# Patient Record
Sex: Male | Born: 1995 | Race: White | Hispanic: No | Marital: Single | Smoking: Current every day smoker
Health system: Southern US, Community
[De-identification: ages and names within clinical notes are randomized; demographics above are authoritative.]

## PROBLEM LIST (undated history)

## (undated) DIAGNOSIS — J45909 Unspecified asthma, uncomplicated: Secondary | ICD-10-CM

---
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
## 2016-01-03 ENCOUNTER — Ambulatory Visit (INDEPENDENT_AMBULATORY_CARE_PROVIDER_SITE_OTHER): Payer: BC Managed Care – PPO

## 2016-01-03 ENCOUNTER — Ambulatory Visit
Admission: EM | Admit: 2016-01-03 | Discharge: 2016-01-03 | Disposition: A | Discharge status: Home / Self Care | Payer: BC Managed Care – PPO | Attending: Family Medicine | Admitting: Family Medicine

## 2016-01-03 DIAGNOSIS — F410 Panic disorder [episodic paroxysmal anxiety] without agoraphobia: Secondary | ICD-10-CM | POA: Diagnosis not present

## 2016-01-03 DIAGNOSIS — B356 Tinea cruris: Secondary | ICD-10-CM | POA: Diagnosis not present

## 2016-01-03 DIAGNOSIS — R0602 Shortness of breath: Secondary | ICD-10-CM | POA: Diagnosis present

## 2016-01-03 DIAGNOSIS — Z79899 Other long term (current) drug therapy: Secondary | ICD-10-CM | POA: Insufficient documentation

## 2016-01-03 DIAGNOSIS — F17210 Nicotine dependence, cigarettes, uncomplicated: Secondary | ICD-10-CM | POA: Insufficient documentation

## 2016-01-03 DIAGNOSIS — R531 Weakness: Secondary | ICD-10-CM | POA: Insufficient documentation

## 2016-01-03 DIAGNOSIS — L739 Follicular disorder, unspecified: Secondary | ICD-10-CM | POA: Diagnosis not present

## 2016-01-03 HISTORY — DX: Unspecified asthma, uncomplicated: J45.909

## 2016-01-03 MED ORDER — KETOCONAZOLE 2 % EX CREA: 1.0000 "application " | TOPICAL_CREAM | Freq: Every day | CUTANEOUS | Status: AC

## 2016-01-03 MED ORDER — MUPIROCIN 2 % EX OINT: 1.0000 "application " | TOPICAL_OINTMENT | Freq: Three times a day (TID) | CUTANEOUS | Status: AC

## 2016-01-03 MED ORDER — LORAZEPAM 1 MG PO TABS
1.0000 mg | ORAL_TABLET | Freq: Once | ORAL | Status: AC
  Administered 2016-01-03: 1 mg via ORAL

## 2016-01-03 MED ORDER — ALPRAZOLAM 0.5 MG PO TABS: 0.5000 mg | ORAL_TABLET | Freq: Three times a day (TID) | ORAL | Status: AC | PRN

## 2016-01-03 NOTE — ED Provider Notes (Signed)
CSN: [id number]     Arrival date & time 01/03/16  1722 History   None    Chief Complaint  Patient presents with  . Shortness of Breath   (Consider location/radiation/quality/duration/timing/severity/associated sxs/prior Treatment) HPI   This is a 20 year old male presents with hyperventilation-that started this morning. States he was having difficulty sleeping next refill sleep around 3 or 4:00 and awoke at 10. He is brought in by his mother. Summoned to his room because of his loud crying stating that he was unable to get any air in his hands and feet were numb. He has a history of asthma and he had a seizure in November from exhaustion and dehydration. He states that everything is good at home his mother states that they give him a lot of love when he comes home from college stairs and is not being bothered by anything to her knowledge. He is obviously upset and hyperventilating  when first seen. Is being home his mother states he's been complaining of heartburn symptoms and right now he states that his trachea feels like it's" closing".     Past Medical History  Diagnosis Date  . Asthma    History reviewed. No pertinent past surgical history. History reviewed. No pertinent family history. Social History  Substance Use Topics  . Smoking status: Current Every Day Smoker    Types: Cigarettes  . Smokeless tobacco: None  . Alcohol Use: Yes     Comment: Socially    Review of Systems  Constitutional: Negative for fever, chills and fatigue.  Gastrointestinal: Positive for nausea.  All other systems reviewed and are negative.   Allergies  Review of patient's allergies indicates no known allergies.  Home Medications   Prior to Admission medications   Medication Sig Start Date End Date Taking? Authorizing Provider  ALPRAZolam Prudy Feeler) 0.5 MG tablet Take 1 tablet (0.5 mg total) by mouth 3 (three) times daily as needed for anxiety. 01/03/16   Lutricia Feil, PA-C  cetirizine (ZYRTEC)  10 MG tablet Take 10 mg by mouth as needed for allergies.    Historical Provider, MD  ketoconazole (NIZORAL) 2 % cream Apply 1 application topically daily. 01/03/16   Lutricia Feil, PA-C  mupirocin ointment (BACTROBAN) 2 % Apply 1 application topically 3 (three) times daily. 01/03/16   Lutricia Feil, PA-C   Meds Ordered and Administered this Visit   Medications  LORazepam (ATIVAN) tablet 1 mg (1 mg Oral Given 01/03/16 1758)    BP 134/70 mmHg  Pulse 73  Temp(Src) 97.8 F (36.6 C) (Oral)  Resp 16  Ht  (1.778 m)  Wt 175 lb (79.379 kg)  BMI 25.11 kg/m2  SpO2 100% No data found.   Physical Exam  Constitutional: He is oriented to person, place, and time. He appears well-developed and well-nourished. No distress.  HENT:  Head: Normocephalic and atraumatic.  Nose: Nose normal.  Mouth/Throat: Oropharynx is clear and moist. No oropharyngeal exudate.  Eyes: Conjunctivae are normal. Pupils are equal, round, and reactive to light.  Neck: Normal range of motion. Neck supple.  Pulmonary/Chest: Effort normal. No respiratory distress. He has no wheezes. He has no rales.  Musculoskeletal: Normal range of motion. He exhibits no edema or tenderness.  Lymphadenopathy:    He has no cervical adenopathy.  Neurological: He is alert and oriented to person, place, and time.  Skin: Skin is warm and dry. He is not diaphoretic.  Psychiatric: His behavior is normal. Judgment and thought content normal.  Patient  is very agitated and crying loudly I first entered the room. I would placed a brown paper bag on his mouth and nose and after breathing for several minutes seemed to calm down somewhat. He was alert and oriented spelled that responded appropriately to questions 100 appropriately to questions  Nursing note and vitals reviewed.   ED Course  Procedures (including critical care time)  Labs Review Labs Reviewed - No data to display  Imaging Review Dg Chest 2 View  01/03/2016  CLINICAL DATA:   Shortness of breath since this morning with weakness and fatigue EXAM: CHEST  2 VIEW COMPARISON:  None. FINDINGS: The heart size and mediastinal contours are within normal limits. Both lungs are clear. The visualized skeletal structures are unremarkable. IMPRESSION: No active cardiopulmonary disease. Electronically Signed   By: Esperanza Heiraymond  Rubner M.D.   On: 01/03/2016 18:25     Visual Acuity Review  Right Eye Distance:   Left Eye Distance:   Bilateral Distance:    Right Eye Near:   Left Eye Near:    Bilateral Near:     ED ECG REPORT   Date: 01/03/2016  EKG Time: 10:36 PM  Rate:77  Rhythm: normal sinus rhythm and sinus arrhythmia,  Axis:normal Intervals:right bundle branch block  ST&T Change: No acute changes  Narrative Interpretation: NSR with sinus arrhythmia       17:58 Medication Given MH  LORazepam (ATIVAN) tablet 1 mg - Dose: 1 mg ; Route: Oral ; Scheduled Time: 1800              MDM   1. Panic attack   2. Tinea cruris   3. Folliculitis    Discharge Medication List as of 01/03/2016  6:59 PM    START taking these medications   Details  ALPRAZolam (XANAX) 0.5 MG tablet Take 1 tablet (0.5 mg total) by mouth 3 (three) times daily as needed for anxiety., Starting 01/03/2016, Until Discontinued, Print    ketoconazole (NIZORAL) 2 % cream Apply 1 application topically daily., Starting 01/03/2016, Until Discontinued, Normal    mupirocin ointment (BACTROBAN) 2 % Apply 1 application topically 3 (three) times daily., Starting 01/03/2016, Until Discontinued, Normal      Plan: 1. Test/x-ray results and diagnosis reviewed with patient 2. rx as per orders; risks, benefits, potential side effects reviewed with patient 3. Recommend supportive treatment with Judicious use of the Xanax. I told him to take this only with the onset of panic attack. He must follow-up with a primary care. As he is in college in [place] he needs to establish with one down there. He states he does not like the  student health center there. He has additional findings today of a tinea cruris and folliculitis of his upper thighs bilaterally. 4. F/u prn if symptoms worsen or don't improve     Lutricia FeilWilliam P Whitten Andreoni, PA-C 01/03/16 2236

## 2016-01-03 NOTE — Discharge Instructions (Signed)
Jock Itch Jock itch (tinea cruris) is a fungal infection of the skin in the groin area. It is sometimes called ringworm, even though it is not caused by worms. It is caused by a fungus, which is a type of germ that thrives in dark, damp places. Jock itch causes a rash and itching in the groin and upper thigh area. It usually goes away in 2-3 weeks with treatment. CAUSES The fungus that causes jock itch may be spread by:  Touching a fungus infection elsewhere on your body--such as athlete's foot--and then touching your groin area.  Sharing towels or clothing with an infected person. RISK FACTORS Jock itch is most common in men and adolescent boys. This condition is more likely to develop from:  Being in hot, humid climates.  Wearing tight-fitting clothing or wet bathing suits for long periods of time.  Participating in sports.  Being overweight.  Having diabetes. SYMPTOMS Symptoms of jock itch may include:  A red, pink, or brown rash in the groin area. The rash may spread to the thighs, anus, and buttocks.  Dry and scaly skin on or around the rash.  Itchiness. DIAGNOSIS Most often, a health care provider can make the diagnosis by looking at your rash. Sometimes, a scraping of the infected skin will be taken. This sample may be tested by looking at it under a microscope or by trying to grow the fungus from the sample (culture).  TREATMENT Treatment for this condition may include:  Antifungal medicine to kill the fungus. This may be in various forms:  Skin cream or ointment.  Medicine taken by mouth.  Skin cream or ointment to reduce the itching.  Compresses or medicated powders to dry the infected skin. HOME CARE INSTRUCTIONS  Take medicines only as directed by your health care provider. Apply skin creams or ointments exactly as directed.  Wear loose-fitting clothing.  Men should wear cotton boxer shorts.  Women should wear cotton underwear.  Change your underwear  every day to keep your groin dry.  Avoid hot baths.  Dry your groin area well after bathing.  Use a separate towel to dry your groin area. This will help to prevent a spreading of the infection to other areas of your body.  Do not scratch the affected area.  Do not share towels with other people. SEEK MEDICAL CARE IF:  Your rash does not improve or it gets worse after 2 weeks of treatment.  Your rash is spreading.  Your rash returns after treatment is finished.  You have a fever.  You have redness, swelling, or pain in the area around your rash.  You have fluid, blood, or pus coming from your rash.  Your have your rash for more than 4 weeks.   This information is not intended to replace advice given to you by your health care provider. Make sure you discuss any questions you have with your health care provider.   Document Released: 10/03/2002 Document Revised: 11/03/2014 Document Reviewed: 07/25/2014 Elsevier Interactive Patient Education 2016 [employer]  Panic Attacks Panic attacks are sudden, short-livedsurges of severe anxiety, fear, or discomfort. They may occur for no reason when you are relaxed, when you are anxious, or when you are sleeping. Panic attacks may occur for a number of reasons:   Healthy people occasionally have panic attacks in extreme, life-threatening situations, such as war or natural disasters. Normal anxiety is a protective mechanism of the body that helps us react to danger (fight or flight response).  Panic attacks are often seen with anxiety disorders, such as panic disorder, social anxiety disorder, generalized anxiety disorder, and phobias. Anxiety disorders cause excessive or uncontrollable anxiety. They may interfere with your relationships or other life activities.  Panic attacks are sometimes seen with other mental illnesses, such as depression and posttraumatic stress disorder.  Certain medical conditions, prescription medicines, and  drugs of abuse can cause panic attacks. SYMPTOMS  Panic attacks start suddenly, peak within 20 minutes, and are accompanied by four or more of the following symptoms:  Pounding heart or fast heart rate (palpitations).  Sweating.  Trembling or shaking.  Shortness of breath or feeling smothered.  Feeling choked.  Chest pain or discomfort.  Nausea or strange feeling in your stomach.  Dizziness, light-headedness, or feeling like you will faint.  Chills or hot flushes.  Numbness or tingling in your lips or hands and feet.  Feeling that things are not real or feeling that you are not yourself.  Fear of losing control or going crazy.  Fear of dying. Some of these symptoms can mimic serious medical conditions. For example, you may think you are having a heart attack. Although panic attacks can be very scary, they are not life threatening. DIAGNOSIS  Panic attacks are diagnosed through an assessment by your health care provider. Your health care provider will ask questions about your symptoms, such as where and when they occurred. Your health care provider will also ask about your medical history and use of alcohol and drugs, including prescription medicines. Your health care provider may order blood tests or other studies to rule out a serious medical condition. Your health care provider may refer you to a mental health professional for further evaluation. TREATMENT   Most healthy people who have one or two panic attacks in an extreme, life-threatening situation will not require treatment.  The treatment for panic attacks associated with anxiety disorders or other mental illness typically involves counseling with a mental health professional, medicine, or a combination of both. Your health care provider will help determine what treatment is best for you.  Panic attacks due to physical illness usually go away with treatment of the illness. If prescription medicine is causing panic  attacks, talk with your health care provider about stopping the medicine, decreasing the dose, or substituting another medicine.  Panic attacks due to alcohol or drug abuse go away with abstinence. Some adults need professional help in order to stop drinking or using drugs. HOME CARE INSTRUCTIONS   Take all medicines as directed by your health care provider.   Schedule and attend follow-up visits as directed by your health care provider. It is important to keep all your appointments. SEEK MEDICAL CARE IF:  You are not able to take your medicines as prescribed.  Your symptoms do not improve or get worse. SEEK IMMEDIATE MEDICAL CARE IF:   You experience panic attack symptoms that are different than your usual symptoms.  You have serious thoughts about hurting yourself or others.  You are taking medicine for panic attacks and have a serious side effect. MAKE SURE YOU:  Understand these instructions.  Will watch your condition.  Will get help right away if you are not doing well or get worse.   This information is not intended to replace advice given to you by your health care provider. Make sure you discuss any questions you have with your health care provider.   Document Released: 10/13/2005 Document Revised: 10/18/2013 Document Reviewed: 05/27/2013 Elsevier Interactive Patient Education  2016 [employer] Inc.  Folliculitis Folliculitis is redness, soreness, and swelling (inflammation) of the hair follicles. This condition can occur anywhere on the body. People with weakened immune systems, diabetes, or obesity have a greater risk of getting folliculitis. CAUSES  Bacterial infection. This is the most common cause.  Fungal infection.  Viral infection.  Contact with certain chemicals, especially oils and tars. Long-term folliculitis can result from bacteria that live in the nostrils. The bacteria may trigger multiple outbreaks of folliculitis over time. SYMPTOMS Folliculitis  most commonly occurs on the scalp, thighs, legs, back, buttocks, and areas where hair is shaved frequently. An early sign of folliculitis is a small, white or yellow, pus-filled, itchy lesion (pustule). These lesions appear on a red, inflamed follicle. They are usually less than 0.2 inches (5 mm) wide. When there is an infection of the follicle that goes deeper, it becomes a boil or furuncle. A group of closely packed boils creates a larger lesion (carbuncle). Carbuncles tend to occur in hairy, sweaty areas of the body. DIAGNOSIS  Your caregiver can usually tell what is wrong by doing a physical exam. A sample may be taken from one of the lesions and tested in a lab. This can help determine what is causing your folliculitis. TREATMENT  Treatment may include:  Applying warm compresses to the affected areas.  Taking antibiotic medicines orally or applying them to the skin.  Draining the lesions if they contain a large amount of pus or fluid.  Laser hair removal for cases of long-lasting folliculitis. This helps to prevent regrowth of the hair. HOME CARE INSTRUCTIONS  Apply warm compresses to the affected areas as directed by your caregiver.  If antibiotics are prescribed, take them as directed. Finish them even if you start to feel better.  You may take over-the-counter medicines to relieve itching.  Do not shave irritated skin.  Follow up with your caregiver as directed. SEEK IMMEDIATE MEDICAL CARE IF:   You have increasing redness, swelling, or pain in the affected area.  You have a fever. MAKE SURE YOU:  Understand these instructions.  Will watch your condition.  Will get help right away if you are not doing well or get worse.   This information is not intended to replace advice given to you by your health care provider. Make sure you discuss any questions you have with your health care provider.   Document Released: 12/22/2001 Document Revised: 11/03/2014 Document Reviewed:  01/13/2012 Elsevier Interactive Patient Education [employer]

## 2016-01-03 NOTE — ED Notes (Addendum)
Patient experiencing shortness of breath which started this morning.  History of asthma and states that he had a seizure in November from exhaustion and dehydration.  He states that both of his hands are numb as well.  Patient comes into our office today hyperventilating.  His mother is present as well.

## 2017-04-25 IMAGING — CR DG CHEST 2V
2 series · 2 of 2 positions shown · non-contrast
Comparison: None.

CLINICAL DATA: Shortness of breath since this morning with weakness
and fatigue

EXAM:
CHEST  2 VIEW

[chest pa]
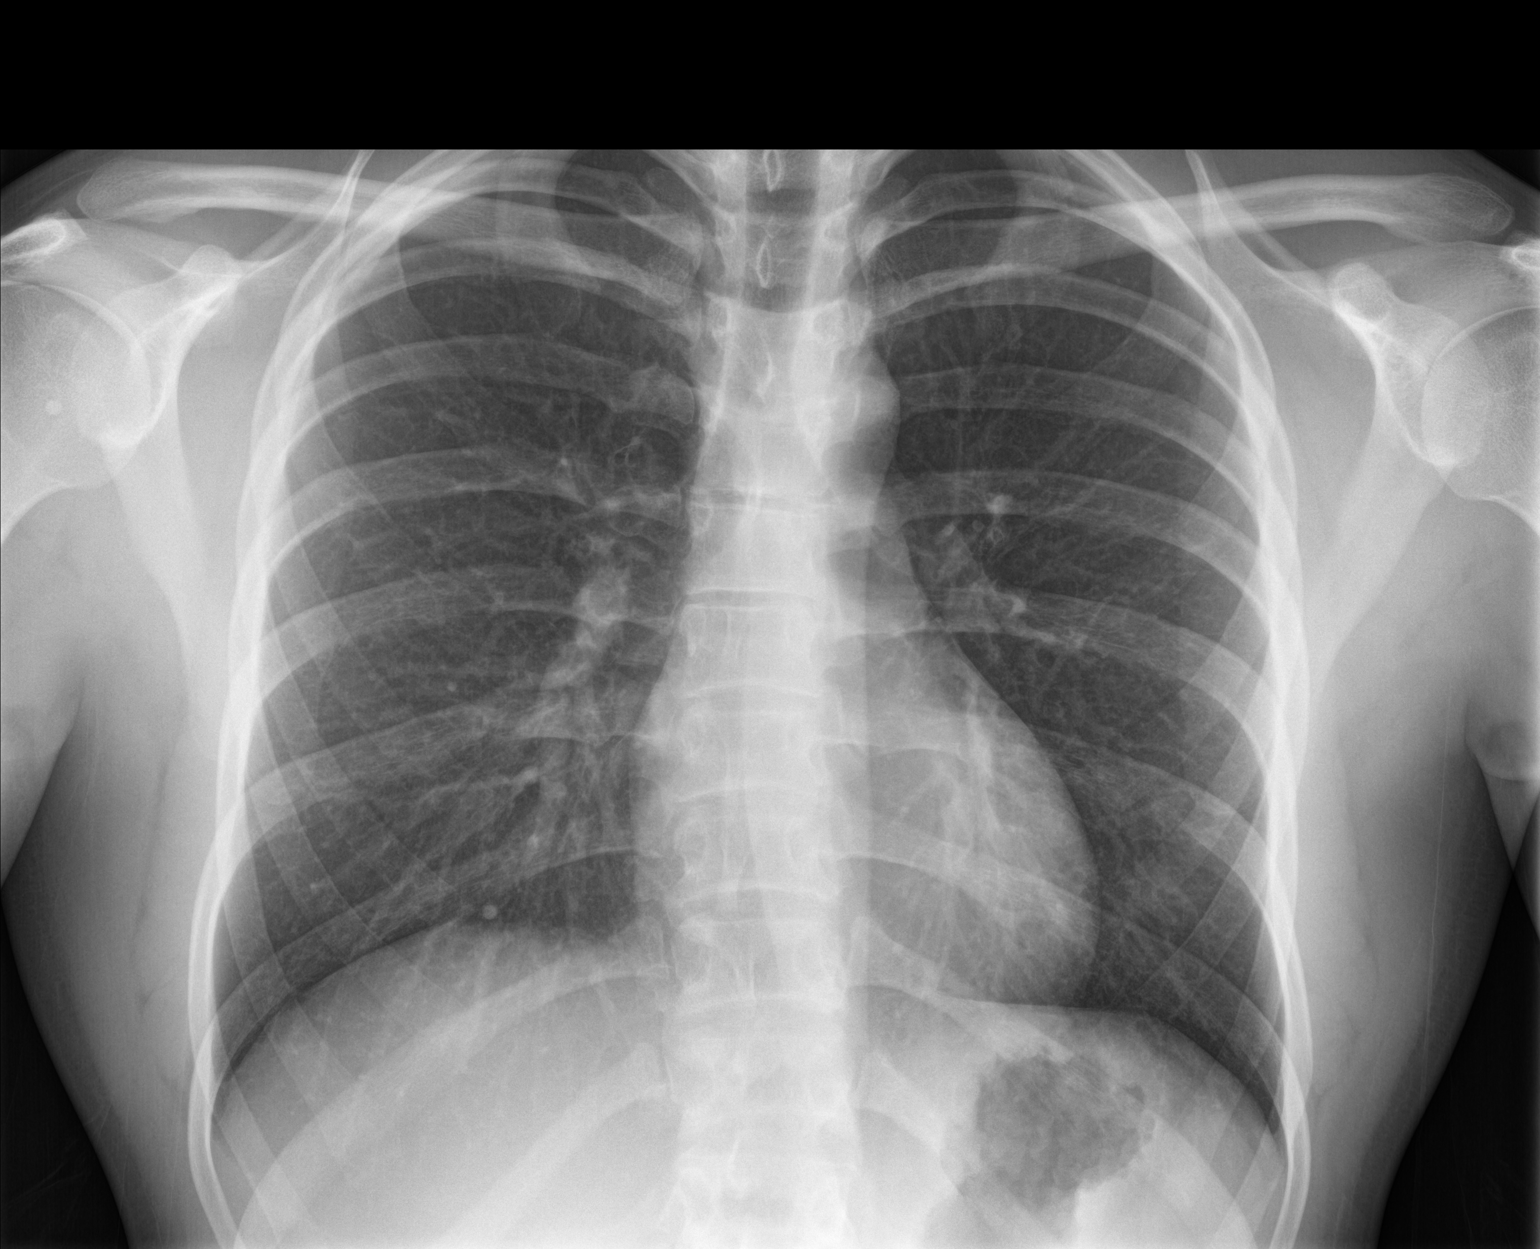

[chest lat]
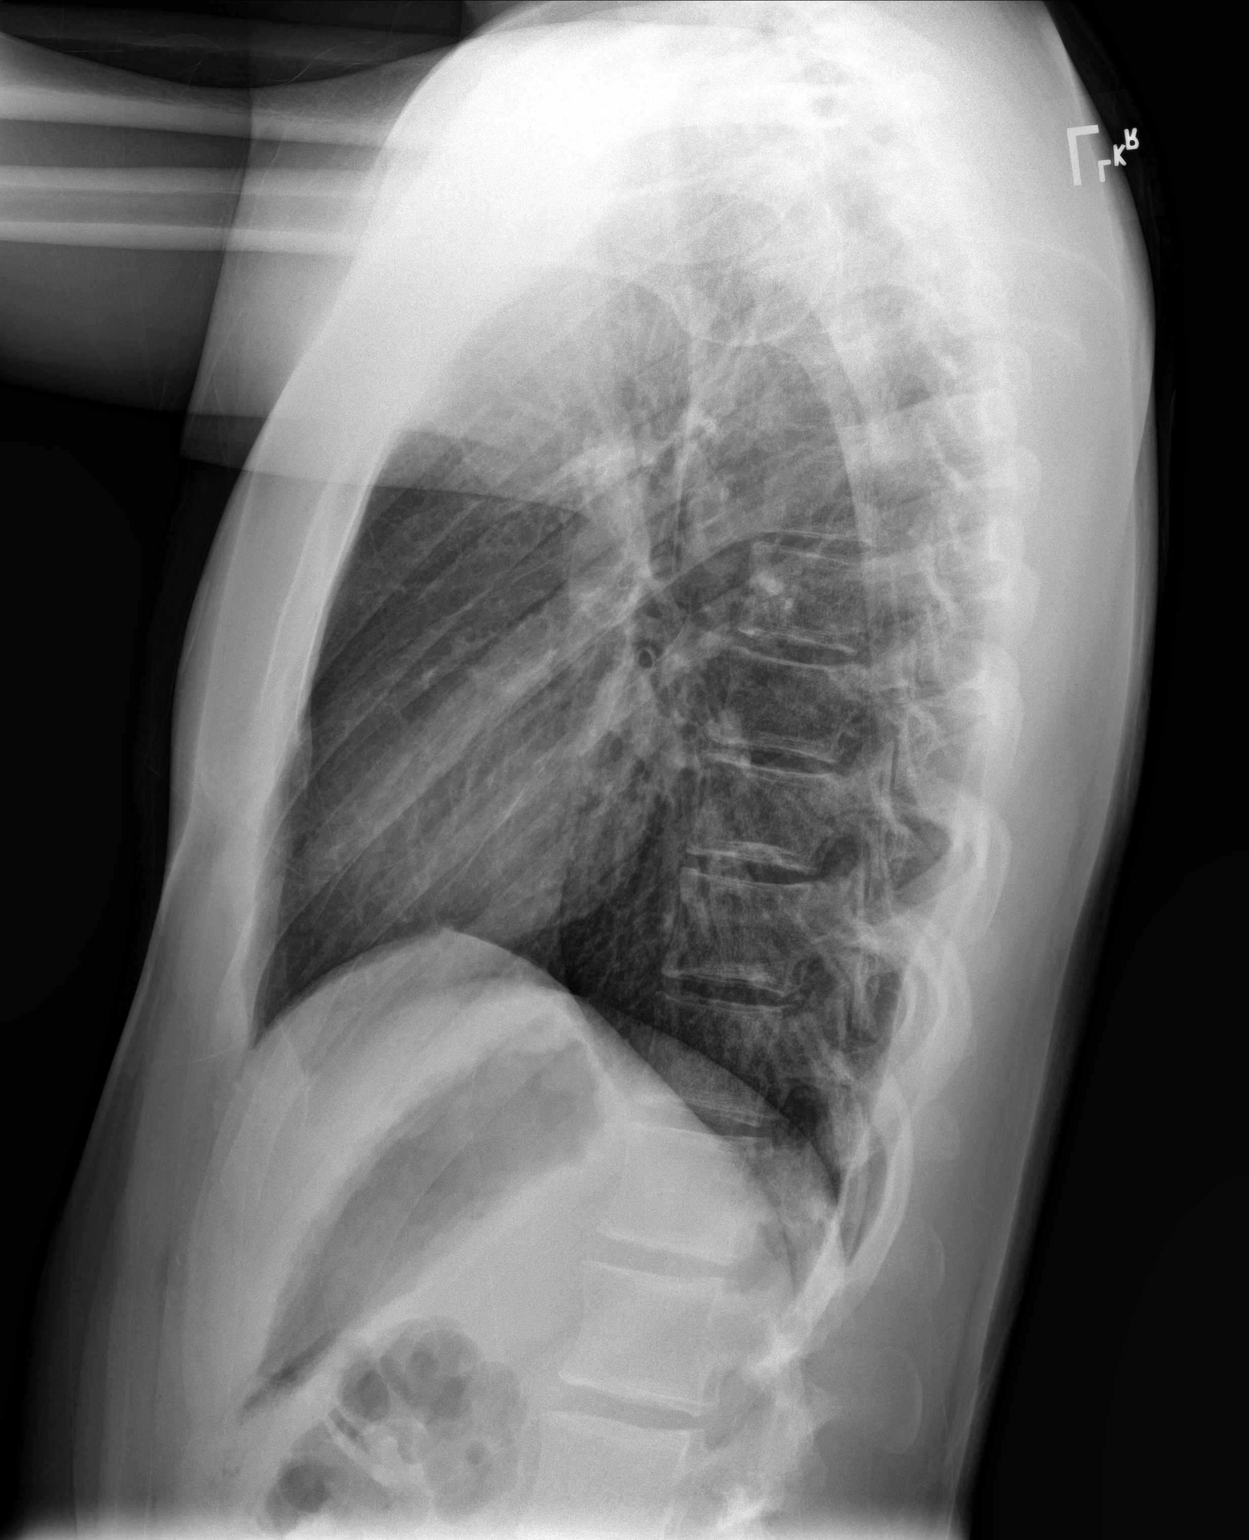

[2 of 2 positions shown; findings below may reference images not displayed]

FINDINGS: The heart size and mediastinal contours are within normal limits.
Both lungs are clear. The visualized skeletal structures are
unremarkable.
IMPRESSION: No active cardiopulmonary disease.
